# Patient Record
Sex: Male | Born: 1945 | Race: Black or African American | Hispanic: No | Smoking: Current every day smoker
Health system: Southern US, Community
[De-identification: ages and names within clinical notes are randomized; demographics above are authoritative.]

## PROBLEM LIST (undated history)

## (undated) ENCOUNTER — Emergency Department (HOSPITAL_COMMUNITY): Admission: EM | Payer: Self-pay | Source: Home / Self Care

## (undated) DIAGNOSIS — M109 Gout, unspecified: Secondary | ICD-10-CM

## (undated) DIAGNOSIS — L309 Dermatitis, unspecified: Secondary | ICD-10-CM

## (undated) DIAGNOSIS — C189 Malignant neoplasm of colon, unspecified: Secondary | ICD-10-CM

## (undated) DIAGNOSIS — I10 Essential (primary) hypertension: Secondary | ICD-10-CM

## (undated) DIAGNOSIS — J45909 Unspecified asthma, uncomplicated: Secondary | ICD-10-CM

## (undated) HISTORY — PX: ABDOMINAL SURGERY: SHX537

## (undated) HISTORY — PX: BOWEL RESECTION: SHX1257

---

## 2010-07-23 ENCOUNTER — Emergency Department (HOSPITAL_COMMUNITY): Admission: EM | Admit: 2010-07-23 | Discharge: 2010-07-24 | Payer: Self-pay | Admitting: Emergency Medicine

## 2013-03-10 ENCOUNTER — Emergency Department (HOSPITAL_COMMUNITY)
Admission: EM | Admit: 2013-03-10 | Discharge: 2013-03-10 | Disposition: A | Discharge status: Home / Self Care | Payer: Medicare Other | Attending: Emergency Medicine | Admitting: Emergency Medicine

## 2013-03-10 ENCOUNTER — Encounter (HOSPITAL_COMMUNITY): Payer: Self-pay | Admitting: *Deleted

## 2013-03-10 DIAGNOSIS — I10 Essential (primary) hypertension: Secondary | ICD-10-CM

## 2013-03-10 DIAGNOSIS — Z79899 Other long term (current) drug therapy: Secondary | ICD-10-CM | POA: Insufficient documentation

## 2013-03-10 DIAGNOSIS — F17200 Nicotine dependence, unspecified, uncomplicated: Secondary | ICD-10-CM | POA: Insufficient documentation

## 2013-03-10 DIAGNOSIS — Z872 Personal history of diseases of the skin and subcutaneous tissue: Secondary | ICD-10-CM | POA: Insufficient documentation

## 2013-03-10 DIAGNOSIS — J45909 Unspecified asthma, uncomplicated: Secondary | ICD-10-CM | POA: Insufficient documentation

## 2013-03-10 HISTORY — DX: Essential (primary) hypertension: I10

## 2013-03-10 HISTORY — DX: Dermatitis, unspecified: L30.9

## 2013-03-10 HISTORY — DX: Unspecified asthma, uncomplicated: J45.909

## 2013-03-10 NOTE — ED Provider Notes (Signed)
History  This chart was scribed for Michael Gaskins, MD by Michael Hogan, ED Scribe. This patient was seen in room APA03/APA03 and the patient's care was started at 5:22 PM.  CSN: [id number]  Arrival date & time 03/10/13  1657   First MD Initiated Contact with Patient 03/10/13 1722      Chief Complaint  Patient presents with  . Hypertension     Patient is a 67 y.o. male presenting with hypertension. The history is provided by the patient. No language interpreter was used.  Hypertension This is a recurrent problem. The current episode started less than 1 hour ago. The problem occurs constantly. The problem has been gradually worsening. Pertinent negatives include no chest pain, no abdominal pain, no headaches and no shortness of breath. Nothing aggravates the symptoms. Nothing relieves the symptoms.    Michael Hogan is a 67 y.o. male with a h/o HTN who presents to the Emergency Department complaining of HTN of 147/105 that he noticed when he checked his BP at [facility] today. BP is 192/103. Pt states that his PCP checked his BP high and was given a prescription for 5 mg Norvasc along with 20 mg lisinopril. He was told to keep checking it and to follow up in a couple of weeks. He denies visual loss, CP, SOB, weakness or numbness in extremities as associated symptoms.                                                                Past Medical History  Diagnosis Date  . Hypertension   . Asthma   . Eczema     History reviewed. No pertinent past surgical history.  No family history on file.  History  Substance Use Topics  . Smoking status: Current Every Day Smoker  . Smokeless tobacco: Not on file  . Alcohol Use: Yes      Review of Systems  Eyes: Negative for visual disturbance.  Respiratory: Negative for shortness of breath.   Cardiovascular: Negative for chest pain.  Gastrointestinal: Negative for nausea, vomiting and abdominal pain.  Neurological: Negative for  headaches.  All other systems reviewed and are negative.    Allergies  Review of patient's allergies indicates not on file.  Home Medications   Current Outpatient Rx  Name  Route  Sig  Dispense  Refill  . albuterol (VENTOLIN HFA) 108 (90 BASE) MCG/ACT inhaler   Inhalation   Inhale 2 puffs into the lungs every 6 (six) hours as needed for wheezing or shortness of breath.         Marland Kitchen amLODipine (NORVASC) 5 MG tablet   Oral   Take 5 mg by mouth daily.         Marland Kitchen aspirin EC 81 MG tablet   Oral   Take 243 mg by mouth once as needed for pain.         Marland Kitchen lisinopril (PRINIVIL,ZESTRIL) 20 MG tablet   Oral   Take 20 mg by mouth daily.           Triage Vitals: BP 192/103  Pulse 88  Temp(Src) 98.6 F (37 C) (Oral)  Resp 20  Ht 5\' 11"  (1.803 m)  Wt 205 lb (92.987 kg)  BMI 28.6 kg/m2  SpO2 99%  Physical Exam  Nursing note and vitals reviewed.  CONSTITUTIONAL: Well developed/well nourished HEAD: Normocephalic/atraumatic EYES: EOMI/PERRL ENMT: Mucous membranes moist NECK: supple no meningeal signs CV: S1/S2 noted, no murmurs/rubs/gallops noted LUNGS: Lungs are clear to auscultation bilaterally, no apparent distress ABDOMEN: soft, nontender, no rebound or guarding NEURO: Pt is awake/alert, moves all extremitiesx4, no arm or leg drift.  No facial droop EXTREMITIES: pulses normal, full ROM SKIN: warm, color normal PSYCH: no abnormalities of mood noted  ED Course  Procedures (including critical care time)  DIAGNOSTIC STUDIES: Oxygen Saturation is 99% on room air, normal by my interpretation.    COORDINATION OF CARE: 5:35 PM-Advised pt that I agree with PCP's treatment and stated that pt is medically stable. Discussed discharge plan which includes keeping a log of BP and following up with PCP with pt at bedside and pt agreed to plan. Advised pt that illegal drugs, smoking and caffeine can cause HTN. Advised pt to return for CP, SOB, severe HA and visual changes or any new  weakness.      MDM  Nursing notes including past medical history and social history reviewed and considered in documentation       I personally performed the services described in this documentation, which was scribed in my presence. The recorded information has been reviewed and is accurate.      Michael Gaskins, MD 03/10/13 [phone]

## 2013-03-10 NOTE — ED Notes (Signed)
Pt c/o of high blood. States he took his bp at [facility] and it was high. BP in triage 192/103.

## 2016-04-25 ENCOUNTER — Other Ambulatory Visit: Payer: Self-pay

## 2016-04-25 ENCOUNTER — Encounter (HOSPITAL_COMMUNITY): Payer: Self-pay | Admitting: Emergency Medicine

## 2016-04-25 ENCOUNTER — Emergency Department (HOSPITAL_COMMUNITY)
Admission: EM | Admit: 2016-04-25 | Discharge: 2016-04-25 | Disposition: A | Discharge status: Home / Self Care | Payer: Self-pay | Source: Home / Self Care | Attending: Emergency Medicine | Admitting: Emergency Medicine

## 2016-04-25 ENCOUNTER — Emergency Department (HOSPITAL_COMMUNITY)
Admission: EM | Admit: 2016-04-25 | Discharge: 2016-04-25 | Disposition: A | Discharge status: Home / Self Care | Payer: Self-pay | Attending: Emergency Medicine | Admitting: Emergency Medicine

## 2016-04-25 ENCOUNTER — Encounter (HOSPITAL_COMMUNITY): Payer: Self-pay

## 2016-04-25 ENCOUNTER — Emergency Department (HOSPITAL_COMMUNITY): Payer: Self-pay

## 2016-04-25 DIAGNOSIS — Z79899 Other long term (current) drug therapy: Secondary | ICD-10-CM

## 2016-04-25 DIAGNOSIS — F17200 Nicotine dependence, unspecified, uncomplicated: Secondary | ICD-10-CM | POA: Insufficient documentation

## 2016-04-25 DIAGNOSIS — J45909 Unspecified asthma, uncomplicated: Secondary | ICD-10-CM | POA: Insufficient documentation

## 2016-04-25 DIAGNOSIS — I10 Essential (primary) hypertension: Secondary | ICD-10-CM | POA: Insufficient documentation

## 2016-04-25 DIAGNOSIS — Z7982 Long term (current) use of aspirin: Secondary | ICD-10-CM | POA: Insufficient documentation

## 2016-04-25 DIAGNOSIS — R0602 Shortness of breath: Secondary | ICD-10-CM | POA: Insufficient documentation

## 2016-04-25 HISTORY — DX: Gout, unspecified: M10.9

## 2016-04-25 LAB — CBC WITH DIFFERENTIAL/PLATELET
BASOS ABS: 0 10*3/uL (ref 0.0–0.1)
BASOS PCT: 0 %
Eosinophils Absolute: 0.1 10*3/uL (ref 0.0–0.7)
Eosinophils Relative: 3 %
HCT: 42.4 % (ref 39.0–52.0)
Hemoglobin: 14.5 g/dL (ref 13.0–17.0)
LYMPHS PCT: 14 %
Lymphs Abs: 0.7 10*3/uL (ref 0.7–4.0)
MCH: 31.9 pg (ref 26.0–34.0)
MCHC: 34.2 g/dL (ref 30.0–36.0)
MCV: 93.2 fL (ref 78.0–100.0)
MONO ABS: 0.4 10*3/uL (ref 0.1–1.0)
MONOS PCT: 8 %
NEUTROS ABS: 3.7 10*3/uL (ref 1.7–7.7)
Neutrophils Relative %: 75 %
PLATELETS: 230 10*3/uL (ref 150–400)
RBC: 4.55 MIL/uL (ref 4.22–5.81)
RDW: 13.3 % (ref 11.5–15.5)
WBC: 4.9 10*3/uL (ref 4.0–10.5)

## 2016-04-25 LAB — BASIC METABOLIC PANEL
ANION GAP: 6 (ref 5–15)
BUN: 8 mg/dL (ref 6–20)
CALCIUM: 8.7 mg/dL — AB (ref 8.9–10.3)
CO2: 27 mmol/L (ref 22–32)
Chloride: 103 mmol/L (ref 101–111)
Creatinine, Ser: 0.89 mg/dL (ref 0.61–1.24)
GFR calc Af Amer: >60 mL/min (ref >60)
GFR calc non Af Amer: >60 mL/min (ref >60)
GLUCOSE: 86 mg/dL (ref 65–99)
Potassium: 3.6 mmol/L (ref 3.5–5.1)
Sodium: 136 mmol/L (ref 135–145)

## 2016-04-25 LAB — TROPONIN I

## 2016-04-25 LAB — BRAIN NATRIURETIC PEPTIDE: B NATRIURETIC PEPTIDE 5: 12 pg/mL (ref 0.0–100.0)

## 2016-04-25 NOTE — ED Provider Notes (Signed)
CSN: [id number]     Arrival date & time 04/25/16  1111 History   First MD Initiated Contact with Patient 04/25/16 1217     Chief Complaint  Patient presents with  . Hypertension   HPI Pt has a history of HTN.   He saw his doctor and had the medication dosages reduced about 1 month ago.   Since then his blood pressure was going up into the 140s and 160s.  He has been feeling fatigued and short of breath.  No chest pain or shortness of breath now.  He called his doctor and was told to come to the ED because his doctor was not available. Past Medical History  Diagnosis Date  . Hypertension   . Asthma   . Eczema   . Gout    History reviewed. No pertinent past surgical history. History reviewed. No pertinent family history. Social History  Substance Use Topics  . Smoking status: Current Every Day Smoker  . Smokeless tobacco: None  . Alcohol Use: Yes     Comment: 1-2 beers a day but not recently    Review of Systems  All other systems reviewed and are negative.     Allergies  Shrimp  Home Medications   Prior to Admission medications   Medication Sig Start Date End Date Taking? Authorizing Provider  albuterol (VENTOLIN HFA) 108 (90 BASE) MCG/ACT inhaler Inhale 2 puffs into the lungs every 6 (six) hours as needed for wheezing or shortness of breath.   Yes Historical Provider, MD  aspirin EC 81 MG tablet Take 243 mg by mouth once as needed for pain.   Yes Historical Provider, MD  lisinopril (PRINIVIL,ZESTRIL) 20 MG tablet Take 20 mg by mouth daily.   Yes Historical Provider, MD   BP 159/90 mmHg  Pulse 65  Temp(Src) 98.3 F (36.8 C) (Oral)  Resp 16  Ht 5\' 11"  (1.803 m)  Wt 81.647 kg  BMI 25.12 kg/m2  SpO2 99% Physical Exam  Constitutional: He appears well-developed and well-nourished. No distress.  HENT:  Head: Normocephalic and atraumatic.  Right Ear: External ear normal.  Left Ear: External ear normal.  Eyes: Conjunctivae are normal. Right eye exhibits no discharge.  Left eye exhibits no discharge. No scleral icterus.  Neck: Neck supple. No tracheal deviation present.  Cardiovascular: Normal rate, regular rhythm and intact distal pulses.   Pulmonary/Chest: Effort normal and breath sounds normal. No stridor. No respiratory distress. He has no wheezes. He has no rales.  Abdominal: Soft. Bowel sounds are normal. He exhibits no distension. There is no tenderness. There is no rebound and no guarding.  Musculoskeletal: He exhibits no edema or tenderness.  Neurological: He is alert. He has normal strength. No cranial nerve deficit (no facial droop, extraocular movements intact, no slurred speech) or sensory deficit. He exhibits normal muscle tone. He displays no seizure activity. Coordination normal.  Skin: Skin is warm and dry. No rash noted.  Psychiatric: He has a normal mood and affect.  Nursing note and vitals reviewed.   ED Course  Procedures (including critical care time) Labs Review Labs Reviewed  BASIC METABOLIC PANEL - Abnormal; Notable for the following:    Calcium 8.7 (*)    All other components within normal limits  CBC WITH DIFFERENTIAL/PLATELET  TROPONIN I  BRAIN NATRIURETIC PEPTIDE    Imaging Review Dg Chest 2 View  04/25/2016  CLINICAL DATA:  HTN, CONGESTION X 7 YEARS, SOB, ASTHMA, SMOKER EXAM: CHEST  2 VIEW COMPARISON:  07/24/2016 FINDINGS:  Thoracic spondylosis. Cardiac and mediastinal margins appear normal, but there appears to be atherosclerotic calcification of the aortic arch. Large lung volumes raise the possibility of emphysema. The lungs appear otherwise clear. No pleural effusion. IMPRESSION: 1. Large lung volumes, possibly from emphysema. 2. Thoracic spondylosis. 3. Atherosclerotic aortic arch. Electronically Signed   By: Van Clines M.D.   On: 04/25/2016 13:24   I have personally reviewed and evaluated these images and lab results as part of my medical decision-making.   EKG Interpretation   Date/Time:  Tuesday Apr 25 2016 12:51:50 EDT Ventricular Rate:  63 PR Interval:  172 QRS Duration: 114 QT Interval:  438 QTC Calculation: 448 R Axis:   -67 Text Interpretation:  Sinus rhythm Left anterior fascicular block Low  voltage, precordial leads No old tracing to compare Confirmed by Taqwa Deem   MD-J, Anzley Dibbern [id number]) on 04/25/2016 1:07:50 PM      MDM   Final diagnoses:  Essential hypertension    Lab tests are reassuring.  No sign of heart failure or acute renal injury.  Pt does have elevated BP but can safely follow up with his PCP for to consider adjustment of his medications    Dorie Rank, MD 04/25/16 1425

## 2016-04-25 NOTE — Discharge Instructions (Signed)
Hypertension Hypertension, commonly called high blood pressure, is when the force of blood pumping through your arteries is too strong. Your arteries are the blood vessels that carry blood from your heart throughout your body. A blood pressure reading consists of a higher number over a lower number, such as 110/72. The higher number (systolic) is the pressure inside your arteries when your heart pumps. The lower number (diastolic) is the pressure inside your arteries when your heart relaxes. Ideally you want your blood pressure below 120/80. Hypertension forces your heart to work harder to pump blood. Your arteries may become narrow or stiff. Having untreated or uncontrolled hypertension can cause heart attack, stroke, kidney disease, and other problems. RISK FACTORS Some risk factors for high blood pressure are controllable. Others are not.  Risk factors you cannot control include:   Race. You may be at higher risk if you are African American.  Age. Risk increases with age.  Gender. Men are at higher risk than women before age 45 years. After age 65, women are at higher risk than men. Risk factors you can control include:  Not getting enough exercise or physical activity.  Being overweight.  Getting too much fat, sugar, calories, or salt in your diet.  Drinking too much alcohol. SIGNS AND SYMPTOMS Hypertension does not usually cause signs or symptoms. Extremely high blood pressure (hypertensive crisis) may cause headache, anxiety, shortness of breath, and nosebleed. DIAGNOSIS To check if you have hypertension, your health care provider will measure your blood pressure while you are seated, with your arm held at the level of your heart. It should be measured at least twice using the same arm. Certain conditions can cause a difference in blood pressure between your right and left arms. A blood pressure reading that is higher than normal on one occasion does not mean that you need treatment. If  it is not clear whether you have high blood pressure, you may be asked to return on a different day to have your blood pressure checked again. Or, you may be asked to monitor your blood pressure at home for 1 or more weeks. TREATMENT Treating high blood pressure includes making lifestyle changes and possibly taking medicine. Living a healthy lifestyle can help lower high blood pressure. You may need to change some of your habits. Lifestyle changes may include:  Following the DASH diet. This diet is high in fruits, vegetables, and whole grains. It is low in salt, red meat, and added sugars.  Keep your sodium intake below 2,300 mg per day.  Getting at least 30-45 minutes of aerobic exercise at least 4 times per week.  Losing weight if necessary.  Not smoking.  Limiting alcoholic beverages.  Learning ways to reduce stress. Your health care provider may prescribe medicine if lifestyle changes are not enough to get your blood pressure under control, and if one of the following is true:  You are 18-59 years of age and your systolic blood pressure is above 140.  You are 60 years of age or older, and your systolic blood pressure is above 150.  Your diastolic blood pressure is above 90.  You have diabetes, and your systolic blood pressure is over 140 or your diastolic blood pressure is over 90.  You have kidney disease and your blood pressure is above 140/90.  You have heart disease and your blood pressure is above 140/90. Your personal target blood pressure may vary depending on your medical conditions, your age, and other factors. HOME CARE INSTRUCTIONS    Have your blood pressure rechecked as directed by your health care provider.   Take medicines only as directed by your health care provider. Follow the directions carefully. Blood pressure medicines must be taken as prescribed. The medicine does not work as well when you skip doses. Skipping doses also puts you at risk for  problems.  Do not smoke.   Monitor your blood pressure at home as directed by your health care provider. SEEK MEDICAL CARE IF:   You think you are having a reaction to medicines taken.  You have recurrent headaches or feel dizzy.  You have swelling in your ankles.  You have trouble with your vision. SEEK IMMEDIATE MEDICAL CARE IF:  You develop a severe headache or confusion.  You have unusual weakness, numbness, or feel faint.  You have severe chest or abdominal pain.  You vomit repeatedly.  You have trouble breathing. MAKE SURE YOU:   Understand these instructions.  Will watch your condition.  Will get help right away if you are not doing well or get worse.   This information is not intended to replace advice given to you by your health care provider. Make sure you discuss any questions you have with your health care provider.   Document Released: 11/20/2005 Document Revised: 04/06/2015 Document Reviewed: 09/12/2013 Elsevier Interactive Patient Education 2016 Elsevier Inc.  

## 2016-04-25 NOTE — ED Notes (Signed)
Pt reports feeling tired for the past 2 weeks and reports his  bp has been elevated x 2 weeks.

## 2016-04-25 NOTE — ED Notes (Signed)
Pt reports high blood pressure for last several weeks. Pt reports ever since bp medication was changed x1 month ago, "blood pressure has been high." pt denies cp,sob. nad noted.

## 2016-07-20 ENCOUNTER — Encounter: Payer: Self-pay | Admitting: Gastroenterology

## 2016-08-23 ENCOUNTER — Ambulatory Visit: Payer: Medicare Other | Admitting: Gastroenterology

## 2016-09-27 ENCOUNTER — Encounter: Payer: Self-pay | Admitting: Gastroenterology

## 2016-09-27 ENCOUNTER — Ambulatory Visit: Payer: Medicare Other | Admitting: Gastroenterology

## 2016-09-27 ENCOUNTER — Telehealth: Payer: Self-pay | Admitting: Gastroenterology

## 2016-09-27 NOTE — Telephone Encounter (Signed)
PT WAS A NO SHOW AND LETTER SENT  °

## 2016-09-27 NOTE — Progress Notes (Deleted)
   Subjective:    Patient ID: Michael Hogan, male    DOB: Apr 07, 1946, 70 y.o.   MRN: [id number]  [facility]   HPI   Past Medical History:  Diagnosis Date  . Asthma   . Eczema   . Gout   . Hypertension      Review of Systems     Objective:   Physical Exam        Assessment & Plan:

## 2016-09-28 NOTE — Telephone Encounter (Signed)
Notified PCP

## 2016-09-28 NOTE — Telephone Encounter (Signed)
REVIEWED. NOTIFY PCP. 

## 2016-11-02 ENCOUNTER — Ambulatory Visit: Payer: Medicare Other | Admitting: Gastroenterology

## 2016-11-02 NOTE — Progress Notes (Deleted)
   Subjective:    Patient ID: Michael Hogan, male    DOB: 1946-04-10, 70 y.o.   MRN: [id number]  HPI    Review of Systems     Objective:   Physical Exam        Assessment & Plan:

## 2016-11-22 ENCOUNTER — Ambulatory Visit: Payer: Medicare Other | Admitting: Gastroenterology

## 2016-11-22 ENCOUNTER — Telehealth: Payer: Self-pay | Admitting: Gastroenterology

## 2016-11-22 ENCOUNTER — Encounter: Payer: Self-pay | Admitting: Gastroenterology

## 2016-11-22 NOTE — Telephone Encounter (Signed)
PT WAS A NO SHOW AND LETTER SENT  °

## 2016-11-22 NOTE — Progress Notes (Deleted)
   Subjective:    Patient ID: Michael Hogan, male    DOB: [birth year], 70 y.o.   MRN: [id number]  HPI    Review of Systems     Objective:   Physical Exam        Assessment & Plan:

## 2016-11-23 NOTE — Telephone Encounter (Signed)
REFERRING PROVIDER NOTIFIED.

## 2016-11-23 NOTE — Telephone Encounter (Signed)
NOTIFY REFERRING PROVIDER.

## 2016-12-07 ENCOUNTER — Ambulatory Visit: Payer: Medicare Other | Admitting: Gastroenterology

## 2016-12-28 ENCOUNTER — Telehealth: Payer: Self-pay | Admitting: Gastroenterology

## 2016-12-28 ENCOUNTER — Ambulatory Visit: Payer: Medicare Other | Admitting: Gastroenterology

## 2016-12-28 ENCOUNTER — Encounter: Payer: Self-pay | Admitting: General Practice

## 2016-12-28 NOTE — Progress Notes (Deleted)
   Subjective:    Patient ID: Michael Hogan, male    DOB: 1946-03-02, 71 y.o.   MRN: [id number]  HPI    Review of Systems     Objective:   Physical Exam        Assessment & Plan:

## 2016-12-28 NOTE — Telephone Encounter (Signed)
PATIENT WAS A NO SHOW X3 °

## 2016-12-28 NOTE — Telephone Encounter (Signed)
DO NOT RESCHEDULE PT. HE WILL NEED TO SEEK GI CARE AT ANOTHER GI OFFICE.

## 2016-12-28 NOTE — Telephone Encounter (Signed)
Discharge letter mailed  

## 2021-10-05 NOTE — Progress Notes (Shared)
[address] [place], [place] [id number]   CLINIC:  Medical Oncology/Hematology  Patient Care Team: [facility] as PCP - [facility], Marga Melnick, MD (Inactive) as Consulting Physician (Gastroenterology)  CHIEF COMPLAINTS/PURPOSE OF CONSULTATION:  ***  HISTORY OF PRESENTING ILLNESS:  Michael Hogan 75 y.o. male is here because of ***, at the request of {REFERRING PHYSICIAN}.  ***He was found to have abnormal CBC from *** ***He denies recent chest pain on exertion, shortness of breath on minimal exertion, pre-syncopal episodes, or palpitations. ***He had not noticed any recent bleeding such as epistaxis, hematuria or hematochezia ***The patient denies over the counter NSAID ingestion. He is not *** on antiplatelets agents. His last colonoscopy was *** ***He had no prior history or diagnosis of cancer. His age appropriate screening programs are up-to-date. ***He denies any pica and eats a variety of diet. ***He never donated blood or received blood transfusion ***The patient was prescribed oral iron supplements and he takes ***  MEDICAL HISTORY:  Past Medical History:  Diagnosis Date   Asthma    Eczema    Gout    Hypertension     SURGICAL HISTORY: No past surgical history on file.  SOCIAL HISTORY: Social History   Socioeconomic History   Marital status: Legally Separated    Spouse name: Not on file   Number of children: Not on file   Years of education: Not on file   Highest education level: Not on file  Occupational History   Not on file  Tobacco Use   Smoking status: Every Day   Smokeless tobacco: Not on file  Substance and Sexual Activity   Alcohol use: Yes    Comment: 1-2 beers a day but not recently   Drug use: No   Sexual activity: Not on file  Other Topics Concern   Not on file  Social History Narrative   Not on file   Social Determinants of Health   Financial Resource Strain: Not on file   Food Insecurity: Not on file  Transportation Needs: Not on file  Physical Activity: Not on file  Stress: Not on file  Social Connections: Not on file  Intimate Partner Violence: Not on file    FAMILY HISTORY: No family history on file.  ALLERGIES:  is allergic to shrimp [shellfish allergy].  MEDICATIONS:  Current Outpatient Medications  Medication Sig Dispense Refill   albuterol (VENTOLIN HFA) 108 (90 BASE) MCG/ACT inhaler Inhale 2 puffs into the lungs every 6 (six) hours as needed for wheezing or shortness of breath.     aspirin EC 81 MG tablet Take 243 mg by mouth once as needed for pain.     lisinopril (PRINIVIL,ZESTRIL) 20 MG tablet Take 20 mg by mouth daily.     No current facility-administered medications for this visit.    REVIEW OF SYSTEMS:   Review of Systems - Oncology   PHYSICAL EXAMINATION: ECOG PERFORMANCE STATUS: {CHL ONC ECOG [id number]}  There were no vitals filed for this visit. There were no vitals filed for this visit. Physical Exam   LABORATORY DATA:  I have reviewed the data as listed No results found for this or any previous visit (from the past 2160 hour(s)).  RADIOGRAPHIC STUDIES: I have personally reviewed the radiological images as listed and agreed with the findings in the report. No results found.  ASSESSMENT:  ***   PLAN:  ***   All questions were answered. The patient knows to call  the clinic with any problems, questions or concerns.  Derek Jack, MD 10/05/21 5:44 PM  [place] [phone]   I, ***, am acting as a scribe for Dr. Derek Jack.  {Add Barista Statement}

## 2021-10-07 ENCOUNTER — Ambulatory Visit (HOSPITAL_COMMUNITY): Payer: Medicare Other | Admitting: Hematology

## 2021-11-09 ENCOUNTER — Emergency Department (HOSPITAL_COMMUNITY): Payer: Medicare Other

## 2021-11-09 ENCOUNTER — Emergency Department (HOSPITAL_COMMUNITY)
Admission: EM | Admit: 2021-11-09 | Discharge: 2021-11-09 | Disposition: A | Discharge status: Home / Self Care | Payer: Medicare Other | Attending: Emergency Medicine | Admitting: Emergency Medicine

## 2021-11-09 ENCOUNTER — Encounter (HOSPITAL_COMMUNITY): Payer: Self-pay | Admitting: Emergency Medicine

## 2021-11-09 ENCOUNTER — Other Ambulatory Visit: Payer: Self-pay

## 2021-11-09 DIAGNOSIS — J45909 Unspecified asthma, uncomplicated: Secondary | ICD-10-CM | POA: Diagnosis not present

## 2021-11-09 DIAGNOSIS — Z7901 Long term (current) use of anticoagulants: Secondary | ICD-10-CM | POA: Diagnosis not present

## 2021-11-09 DIAGNOSIS — Z85038 Personal history of other malignant neoplasm of large intestine: Secondary | ICD-10-CM | POA: Insufficient documentation

## 2021-11-09 DIAGNOSIS — I10 Essential (primary) hypertension: Secondary | ICD-10-CM | POA: Insufficient documentation

## 2021-11-09 DIAGNOSIS — Z79899 Other long term (current) drug therapy: Secondary | ICD-10-CM | POA: Insufficient documentation

## 2021-11-09 DIAGNOSIS — F17210 Nicotine dependence, cigarettes, uncomplicated: Secondary | ICD-10-CM | POA: Diagnosis not present

## 2021-11-09 DIAGNOSIS — R197 Diarrhea, unspecified: Secondary | ICD-10-CM | POA: Diagnosis not present

## 2021-11-09 DIAGNOSIS — R109 Unspecified abdominal pain: Secondary | ICD-10-CM

## 2021-11-09 DIAGNOSIS — E876 Hypokalemia: Secondary | ICD-10-CM | POA: Insufficient documentation

## 2021-11-09 HISTORY — DX: Malignant neoplasm of colon, unspecified: C18.9

## 2021-11-09 LAB — CBC WITH DIFFERENTIAL/PLATELET
Abs Immature Granulocytes: 0.08 10*3/uL — ABNORMAL HIGH (ref 0.00–0.07)
Basophils Absolute: 0 10*3/uL (ref 0.0–0.1)
Basophils Relative: 1 %
Eosinophils Absolute: 0 10*3/uL (ref 0.0–0.5)
Eosinophils Relative: 0 %
HCT: 28.5 % — ABNORMAL LOW (ref 39.0–52.0)
Hemoglobin: 9.2 g/dL — ABNORMAL LOW (ref 13.0–17.0)
Immature Granulocytes: 1 %
Lymphocytes Relative: 14 %
Lymphs Abs: 0.8 10*3/uL (ref 0.7–4.0)
MCH: 23.2 pg — ABNORMAL LOW (ref 26.0–34.0)
MCHC: 32.3 g/dL (ref 30.0–36.0)
MCV: 72 fL — ABNORMAL LOW (ref 80.0–100.0)
Monocytes Absolute: 0.4 10*3/uL (ref 0.1–1.0)
Monocytes Relative: 7 %
Neutro Abs: 4.4 10*3/uL (ref 1.7–7.7)
Neutrophils Relative %: 77 %
Platelets: 370 10*3/uL (ref 150–400)
RBC: 3.96 MIL/uL — ABNORMAL LOW (ref 4.22–5.81)
RDW: 27.7 % — ABNORMAL HIGH (ref 11.5–15.5)
Smear Review: NORMAL
WBC: 5.8 10*3/uL (ref 4.0–10.5)
nRBC: 0 % (ref 0.0–0.2)

## 2021-11-09 LAB — COMPREHENSIVE METABOLIC PANEL
ALT: 11 U/L (ref 0–44)
AST: 12 U/L — ABNORMAL LOW (ref 15–41)
Albumin: 2.2 g/dL — ABNORMAL LOW (ref 3.5–5.0)
Alkaline Phosphatase: 65 U/L (ref 38–126)
Anion gap: 8 (ref 5–15)
BUN: 7 mg/dL — ABNORMAL LOW (ref 8–23)
CO2: 26 mmol/L (ref 22–32)
Calcium: 7.8 mg/dL — ABNORMAL LOW (ref 8.9–10.3)
Chloride: 100 mmol/L (ref 98–111)
Creatinine, Ser: 1.23 mg/dL (ref 0.61–1.24)
GFR, Estimated: >60 mL/min (ref >60)
Glucose, Bld: 101 mg/dL — ABNORMAL HIGH (ref 70–99)
Potassium: 3 mmol/L — ABNORMAL LOW (ref 3.5–5.1)
Sodium: 134 mmol/L — ABNORMAL LOW (ref 135–145)
Total Bilirubin: 0.7 mg/dL (ref 0.3–1.2)
Total Protein: 6.3 g/dL — ABNORMAL LOW (ref 6.5–8.1)

## 2021-11-09 MED ORDER — SODIUM CHLORIDE 0.9 % IV BOLUS
1000.0000 mL | Freq: Once | INTRAVENOUS | Status: AC
  Administered 2021-11-09: 1000 mL via INTRAVENOUS

## 2021-11-09 MED ORDER — POTASSIUM CHLORIDE CRYS ER 20 MEQ PO TBCR
40.0000 meq | EXTENDED_RELEASE_TABLET | Freq: Once | ORAL | Status: AC
  Administered 2021-11-09: 40 meq via ORAL
  Filled 2021-11-09: qty 2

## 2021-11-09 MED ORDER — IOHEXOL 300 MG/ML  SOLN
100.0000 mL | Freq: Once | INTRAMUSCULAR | Status: AC | PRN
  Administered 2021-11-09: 80 mL via INTRAVENOUS

## 2021-11-09 NOTE — ED Provider Notes (Signed)
[facility] EMERGENCY DEPARTMENT Provider Note   CSN: [id number] Arrival date & time: 11/09/21  1213     History Chief Complaint  Patient presents with   Abdominal Pain    Michael Hogan is a 75 y.o. male.  Patient presented to ER chief complaint of follow-up for abdominal surgery.  He states he had possible colon resection done in [place] around November 10.  He left AMA around November 15 because he states that they were "stressing him down."  He did not follow-up with his primary care doctor today and was sent to the ER for further evaluation.  He states he still had persistent diarrhea and persistent abdominal pain since surgery unchanged today.  Denies any fevers or chills no vomiting reported.      Past Medical History:  Diagnosis Date   Asthma    Colon cancer ([place])    Eczema    Gout    Hypertension     There are no problems to display for this patient.   Past Surgical History:  Procedure Laterality Date   ABDOMINAL SURGERY     BOWEL RESECTION         History reviewed. No pertinent family history.  Social History   Tobacco Use   Smoking status: Every Day    Packs/day: 0.50    Types: Cigarettes  Vaping Use   Vaping Use: Never used  Substance Use Topics   Alcohol use: Yes    Comment: 1-2 beers a day but not recently   Drug use: No    Home Medications Prior to Admission medications   Medication Sig Start Date End Date Taking? Authorizing Provider  albuterol (VENTOLIN HFA) 108 (90 BASE) MCG/ACT inhaler Inhale 2 puffs into the lungs every 6 (six) hours as needed for wheezing or shortness of breath.    [provider]  aspirin EC 81 MG tablet Take 243 mg by mouth once as needed for pain.    [provider]  ELIQUIS DVT/PE STARTER PACK Take by mouth. 10/21/21   [provider]  lisinopril (PRINIVIL,ZESTRIL) 20 MG tablet Take 20 mg by mouth daily.    [provider]    Allergies    Shrimp [shellfish  allergy]  Review of Systems   Review of Systems  Constitutional:  Negative for fever.  HENT:  Negative for ear pain and sore throat.   Eyes:  Negative for pain.  Respiratory:  Negative for cough.   Cardiovascular:  Negative for chest pain.  Gastrointestinal:  Positive for abdominal pain.  Genitourinary:  Negative for flank pain.  Musculoskeletal:  Negative for back pain.  Skin:  Negative for color change and rash.  Neurological:  Negative for syncope.  All other systems reviewed and are negative.  Physical Exam Updated Vital Signs BP 128/75   Pulse 77   Temp 98.3 F (36.8 C) (Oral)   Resp 15   Ht 5\' 11"  (1.803 m)   Wt 78.9 kg   SpO2 98%   BMI 24.27 kg/m   Physical Exam Constitutional:      Appearance: He is well-developed.  HENT:     Head: Normocephalic.     Nose: Nose normal.  Eyes:     Extraocular Movements: Extraocular movements intact.  Cardiovascular:     Rate and Rhythm: Normal rate.  Pulmonary:     Effort: Pulmonary effort is normal.  Abdominal:     Comments: Large mid abdominal surgical wound present.  Appears to be plan secondary healing.  Mid abdominal rubber band type sutures visible.  Serosanguineous drainage noted no cellulitis no purulent drainage no foul odor noted.  Skin:    Coloration: Skin is not jaundiced.  Neurological:     Mental Status: He is alert. Mental status is at baseline.    ED Results / Procedures / Treatments   Labs (all labs ordered are listed, but only abnormal results are displayed) Labs Reviewed  CBC WITH DIFFERENTIAL/PLATELET - Abnormal; Notable for the following components:      Result Value   RBC 3.96 (*)    Hemoglobin 9.2 (*)    HCT 28.5 (*)    MCV 72.0 (*)    MCH 23.2 (*)    RDW 27.7 (*)    Abs Immature Granulocytes 0.08 (*)    All other components within normal limits  COMPREHENSIVE METABOLIC PANEL - Abnormal; Notable for the following components:   Sodium 134 (*)    Potassium 3.0 (*)    Glucose, Bld 101 (*)     BUN 7 (*)    Calcium 7.8 (*)    Total Protein 6.3 (*)    Albumin 2.2 (*)    AST 12 (*)    All other components within normal limits    EKG EKG Interpretation  Date/Time:  Wednesday November 09 2021 12:47:51 EST Ventricular Rate:  93 PR Interval:  141 QRS Duration: 113 QT Interval:  376 QTC Calculation: 468 R Axis:   -58 Text Interpretation: Sinus tachycardia Multiple premature complexes, vent & supraven Left anterior fascicular block Low voltage, precordial leads Confirmed by Thamas Jaegers ([id number]) on 11/09/2021 2:12:30 PM  Radiology No results found.  Procedures Procedures   Medications Ordered in ED Medications  sodium chloride 0.9 % bolus 1,000 mL (0 mLs Intravenous Stopped 11/09/21 1519)  potassium chloride SA (KLOR-CON M) CR tablet 40 mEq (40 mEq Oral Given 11/09/21 1519)    ED Course  I have reviewed the triage vital signs and the nursing notes.  Pertinent labs & imaging results that were available during my care of the patient were reviewed by me and considered in my medical decision making (see chart for details).    MDM Rules/Calculators/A&P                           Labs are normal white count 5 hemoglobin 9 chemistry shows mild hypokalemia which was repleted here in the ER.  CT on pelvis pursued.  From a wound standpoint I feel the wound is well-healing no evidence of active infection.  Recommend continue wet-to-dry's and outpatient follow-up with his surgeon within the week.  Advising immediate return if he has fevers purulent discharge redness or any additional concerns.  Will be pending final CT analysis.  Final Clinical Impression(s) / ED Diagnoses Final diagnoses:  Abdominal pain, unspecified abdominal location    Rx / DC Orders ED Discharge Orders     None        Luna Fuse, MD 11/09/21 1520

## 2021-11-09 NOTE — ED Provider Notes (Signed)
Patient has some abdominal fluid collection seen on CT.  Patient with minimal discomfort in his abdomen no fever no vomiting.  Patient does not have a white count.  I spoke with general surgery Dr. Constance Haw and she felt like the patient should follow-up with his surgeon within the week and we will give him a copy of the CT scan.  Patient was instructed to return to the [facility] emergency department if he develops severe pain vomiting or fever   Milton Ferguson, MD 11/09/21 [phone]

## 2021-11-09 NOTE — Discharge Instructions (Signed)
Follow-up with your surgeon either this week or next week.  Bring a copy of the CT scan report and also the disc of the CT.  Return sooner to get seen if you have significant abdominal pain or vomiting or high fever.  It is important to go back to [facility] where he had the surgery done if you need to be seen prior to your appointment with your surgeon

## 2021-11-09 NOTE — ED Notes (Signed)
Pt given discharge papers as well as disc to take to his surgeon appointment next week. Things placed in bag and communicated that he understands.

## 2021-11-09 NOTE — ED Triage Notes (Addendum)
Pt arrives via [facility] from PCP at [place] with concern of infection at site of abdominal incision which is noted to be malodorous and seeping per EMS. Recent inpatient stay at [facility] health in [place] for colon resection, pt left AMA. Pt reporting 5/10 lower abdominal pain

## 2022-08-02 IMAGING — CT CT ABD-PELV W/ CM
2 of 5 series · 15 of 46 positions shown, 17 images · IV contrast (Omnipaque or Isovue)
Comparison: None.

CLINICAL DATA: Postoperative abdominal pain. Concern for infection
at abdominal incision.

EXAM:
CT ABDOMEN AND PELVIS WITH CONTRAST
TECHNIQUE: Multidetector CT imaging of the abdomen and pelvis was performed
using the standard protocol following bolus administration of
intravenous contrast.
CONTRAST:  80mL OMNIPAQUE IOHEXOL 300 MG/ML  SOLN

[Series 2: axial st · axial · 0.89mm/px · z∈[+668,+1108]mm · 12 of 100 slices shown, 14 images]
[im 6/100  soft-tissue]
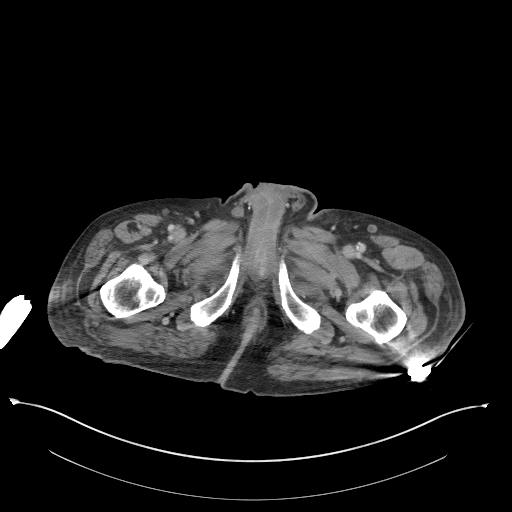
[im 6/100  bone]
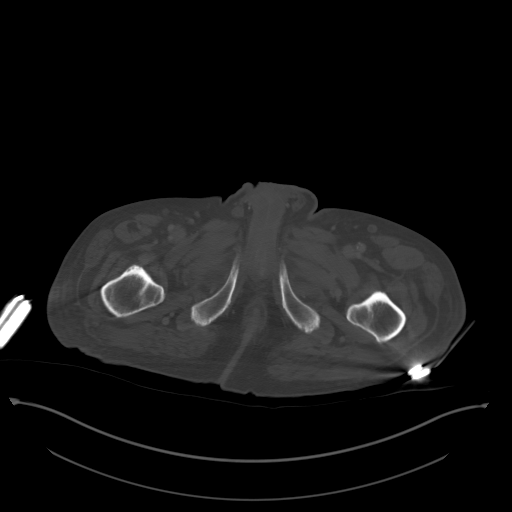
[im 16/100  soft-tissue]
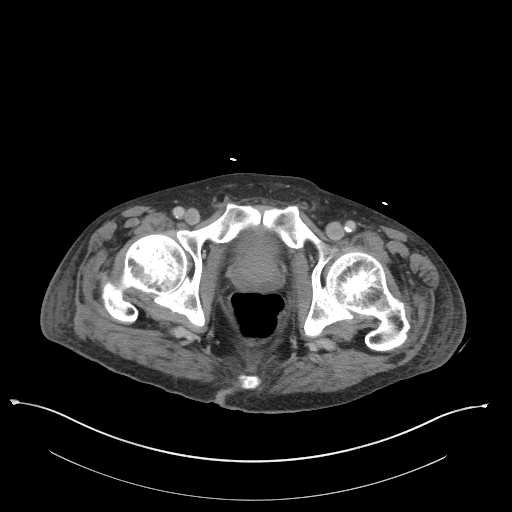
[im 21/100  soft-tissue]
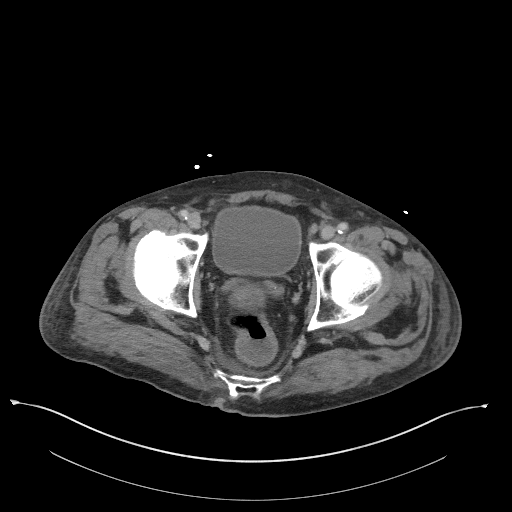
[im 32/100  soft-tissue]
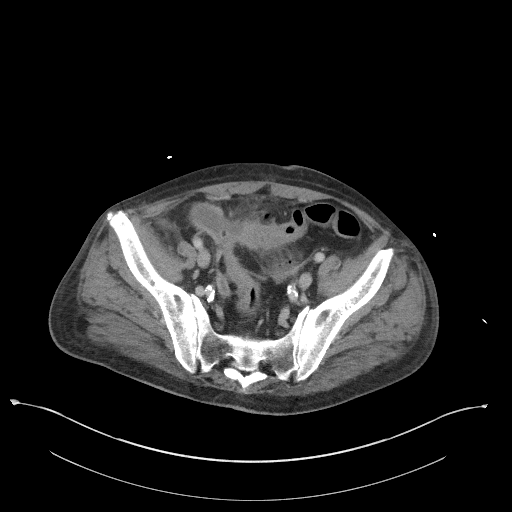
[im 37/100  soft-tissue]
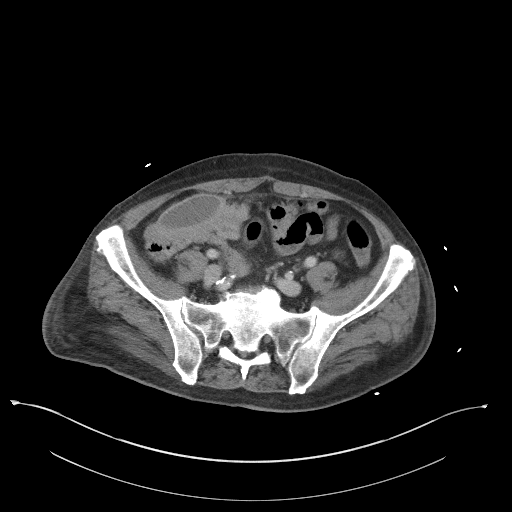
[im 47/100  soft-tissue]
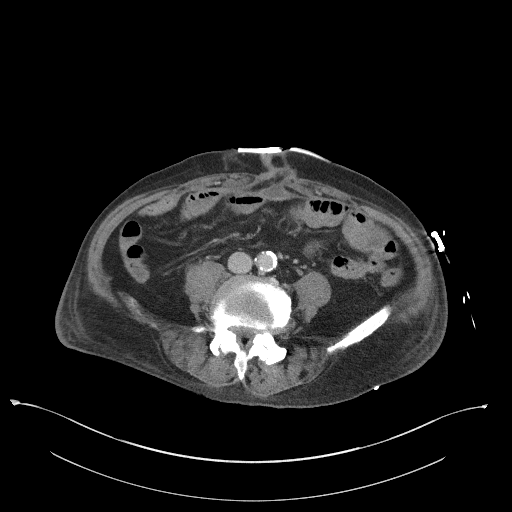
[im 53/100  soft-tissue]
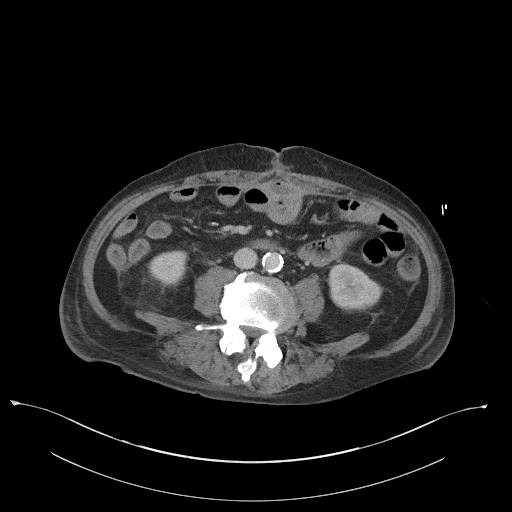
[im 63/100  soft-tissue]
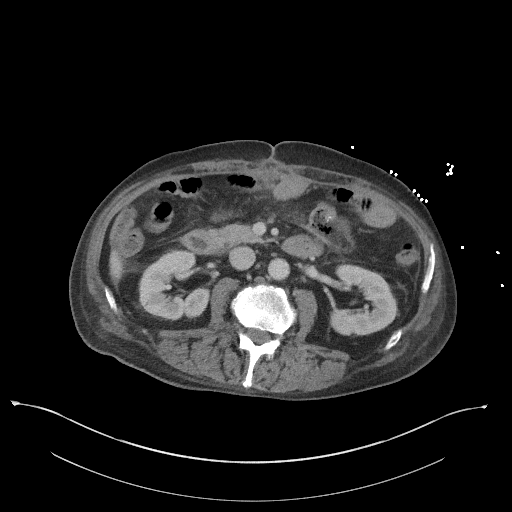
[im 68/100  soft-tissue]
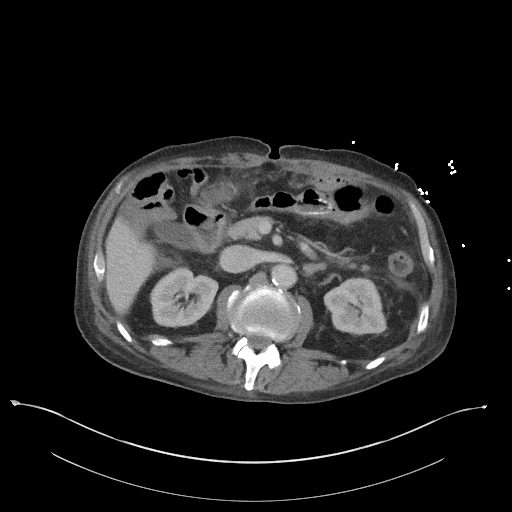
[im 68/100  bone]
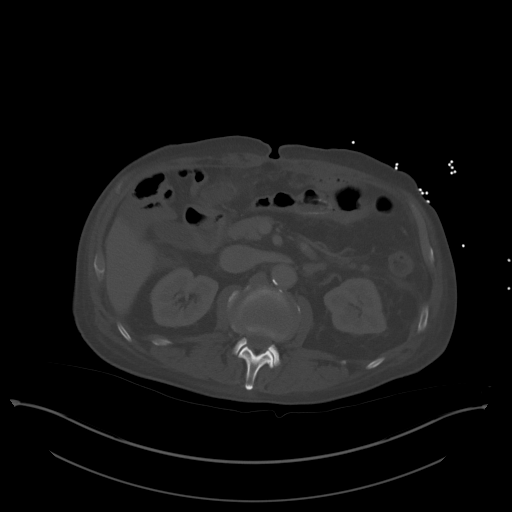
[im 79/100  soft-tissue]
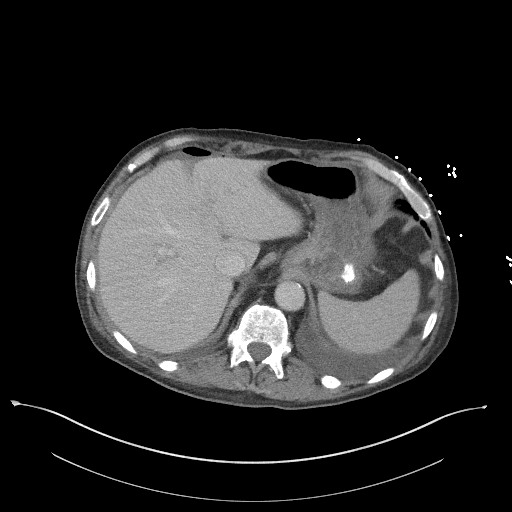
[im 84/100  soft-tissue]
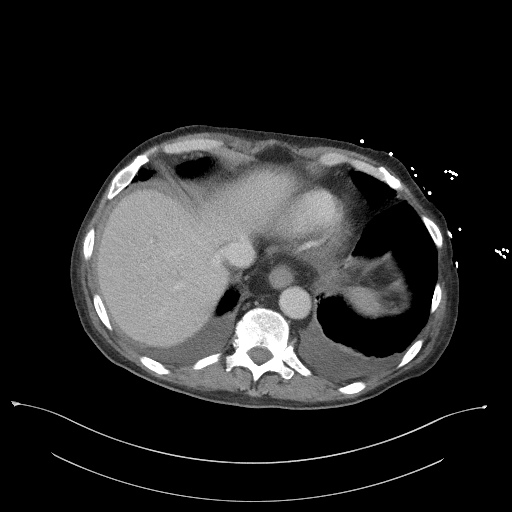
[im 94/100  soft-tissue]
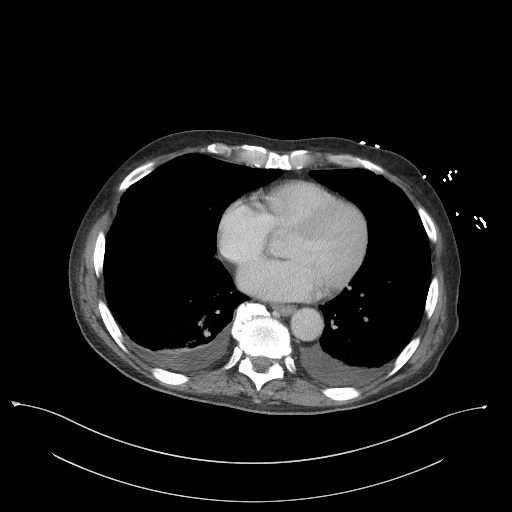

[Series 5: coronal st · coronal · 0.86mm/px · 3 of 98 slices shown]
[im 33/98  soft-tissue]
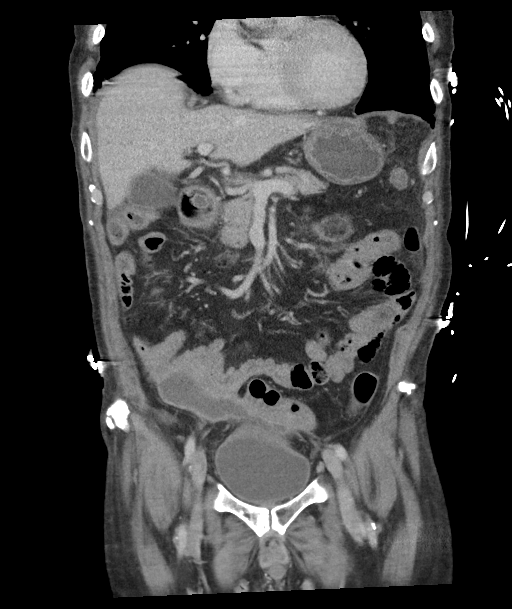
[im 44/98  soft-tissue]
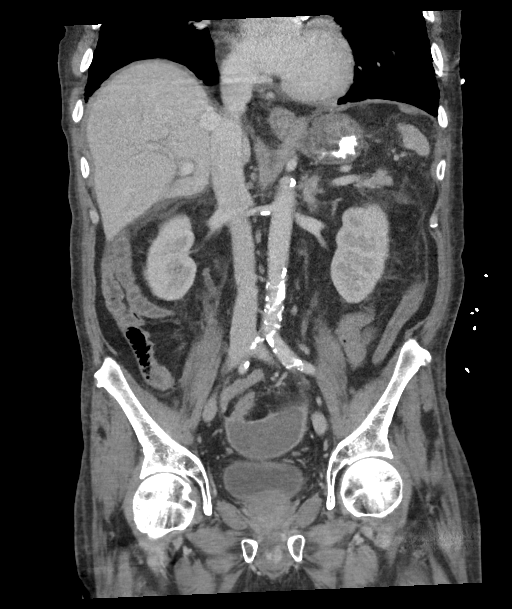
[im 54/98  soft-tissue]
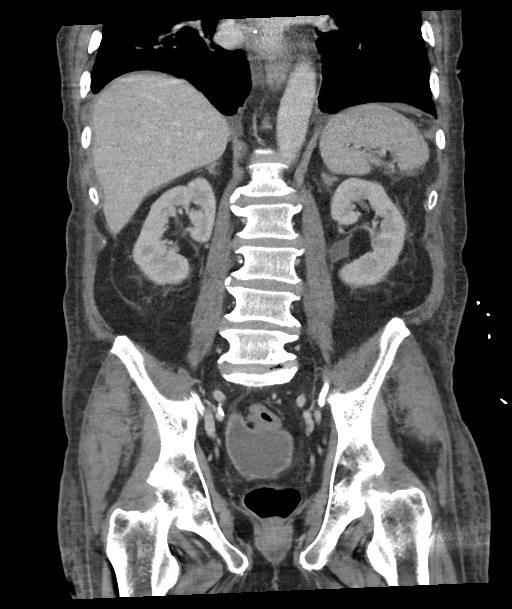

[15 of 46 positions shown; findings below may reference images not displayed]

FINDINGS: Lower chest: There are small bilateral pleural effusions with
atelectasis in both lung bases.

Hepatobiliary: No focal liver abnormality is seen. No gallstones,
gallbladder wall thickening, there is a hypodensity in the left lobe
of the liver which is too small to characterize, most likely a small
cyst or hemangioma. Otherwise, the liver, gallbladder and bile ducts
appear within normal limits.

Pancreas: Unremarkable. No pancreatic ductal dilatation or
surrounding inflammatory changes.

Spleen: Normal in size without focal abnormality.

Adrenals/Urinary Tract: Adrenal glands are unremarkable. Kidneys are
normal, without renal calculi, focal lesion, or hydronephrosis.
Bladder is unremarkable.

Stomach/Bowel: Right hemicolectomy changes are present. Anastomosis
is seen in the central abdomen which appears within normal limits.
There is no evidence for bowel obstruction or pneumatosis. There is
some sigmoid colon wall thickening abutting fluid collections with
surrounding inflammation. The stomach is within normal limits.

Vascular/Lymphatic: Aortic atherosclerosis. No enlarged abdominal or
pelvic lymph nodes.

Reproductive: Prostate is unremarkable.

Other: There are 3 intra-abdominal loculated enhancing fluid
collections containing small amounts of air worrisome for abscesses.
These collections abut bowel loops and peritoneum. The largest is in
the pelvis measuring 5.7 x 6.6 x 7.6 cm. There is some discontinuity
of the enhancing wall along the superior margin with some free air
and stranding at this level.

Second fluid collection is seen in the anterior right lower quadrant
measuring 7.2 x 2.4 x 5.7 cm.

Third collection is seen midline abutting the anterior abdominal
wall measuring 8.4 x 1.3 x 4.1 cm.

Additionally there is a small amount of free fluid and free air in
the right upper quadrant.

Midline abdominal incision present. There is a small fat containing
incisional hernia. There is a small subcutaneous collection in the
lower midline anterior abdominal wall along the inferior margin of
the incision measuring 1.0 x 1.8 x 2.6 cm.

Musculoskeletal: Degenerative changes affect the spine
IMPRESSION: 1. Small amount of free fluid and free air in the abdomen likely
related to recent surgery.
2. There are 3 intra-abdominal enhancing fluid collections
containing air worrisome for abscesses. The largest is in the pelvis
measuring up to 7.6 cm.
3. Small incisional subcutaneous fluid collection also worrisome for
abscess.
4. Right hemicolectomy changes. No bowel obstruction. Wall
thickening of sigmoid colon adjacent to fluid collections, likely
reactive colitis.
5. Small bilateral pleural effusions.
6.  Aortic Atherosclerosis ([id number]).
